# Patient Record
Sex: Female | Born: 1960 | Race: Black or African American | Hispanic: No | Marital: Single | Smoking: Never smoker
Health system: Southern US, Community
[De-identification: ages and names within clinical notes are randomized; demographics above are authoritative.]

## PROBLEM LIST (undated history)

## (undated) DIAGNOSIS — E119 Type 2 diabetes mellitus without complications: Secondary | ICD-10-CM

---

## 2006-01-14 ENCOUNTER — Ambulatory Visit (HOSPITAL_COMMUNITY): Admission: RE | Admit: 2006-01-14 | Discharge: 2006-01-14 | Payer: Self-pay | Admitting: Obstetrics & Gynecology

## 2006-01-27 ENCOUNTER — Encounter: Admission: RE | Admit: 2006-01-27 | Discharge: 2006-01-27 | Payer: Self-pay | Admitting: Internal Medicine

## 2006-05-30 ENCOUNTER — Emergency Department (HOSPITAL_COMMUNITY): Admission: EM | Admit: 2006-05-30 | Discharge: 2006-05-30 | Payer: Self-pay | Admitting: Family Medicine

## 2006-05-30 ENCOUNTER — Emergency Department (HOSPITAL_COMMUNITY): Admission: EM | Admit: 2006-05-30 | Discharge: 2006-05-30 | Payer: Self-pay | Admitting: Emergency Medicine

## 2007-01-16 ENCOUNTER — Ambulatory Visit (HOSPITAL_COMMUNITY): Admission: RE | Admit: 2007-01-16 | Discharge: 2007-01-16 | Payer: Self-pay | Admitting: Obstetrics & Gynecology

## 2007-02-24 IMAGING — US US EXTREM LOW VENOUS BILAT
1 series · 13 of 24 positions shown · non-contrast
Comparison: none

CLINICAL DATA: Symptomatic bilateral lower extremity varicose veins

Bilateral  lower extremity venous Doppler ultrasound:
TECHNIQUE: Gray-scale sonography with compression as well as color and duplex
Doppler ultrasound were performed to evaluate the deep venous system from the
level of the common femoral vein through the popliteal and proximal calf veins.
Standing gray-scale sonography and duplex Doppler evaluation of the superficial
venous system with augmentation was performed.

[Series 1: unknown · 13 of 48 slices shown]
[im 1/48]
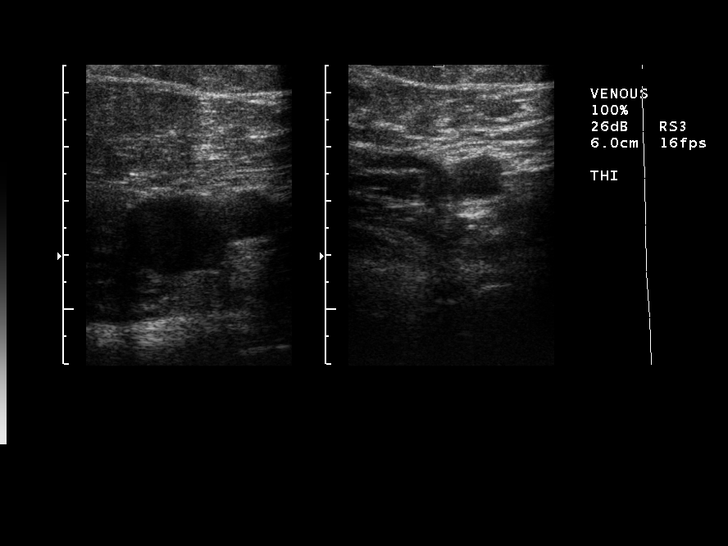
[im 5/48]
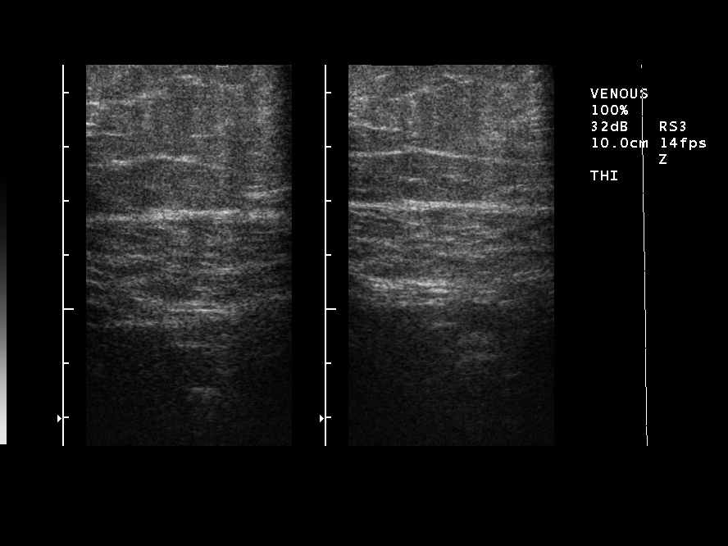
[im 9/48]
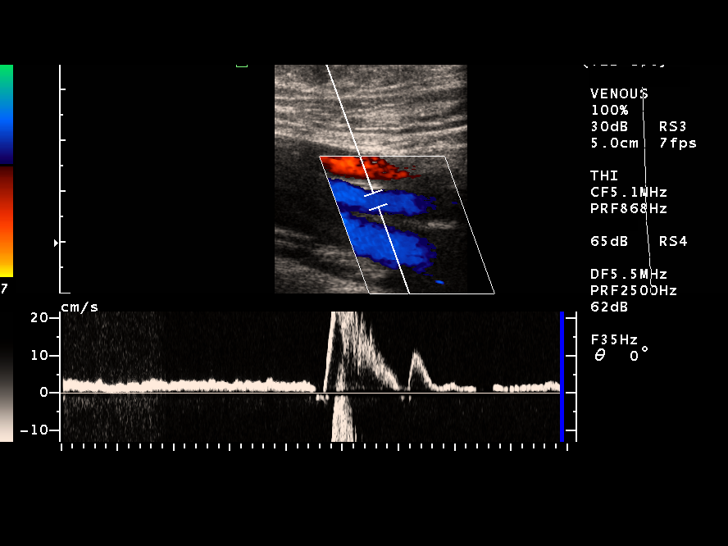
[im 13/48]
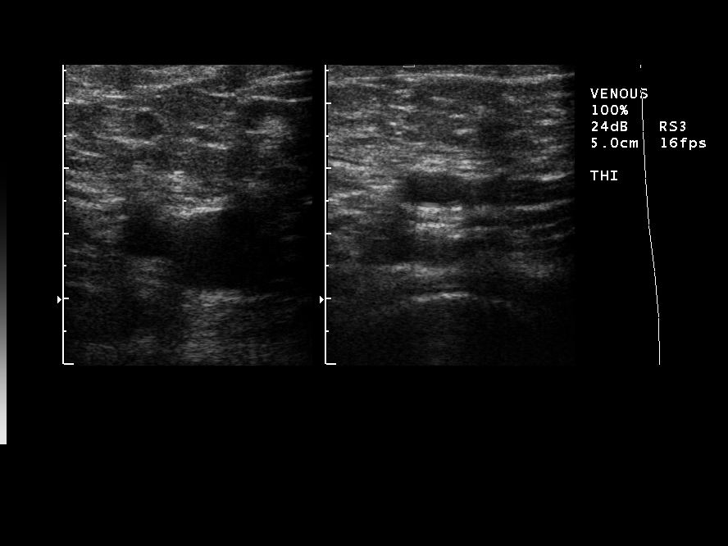
[im 17/48]
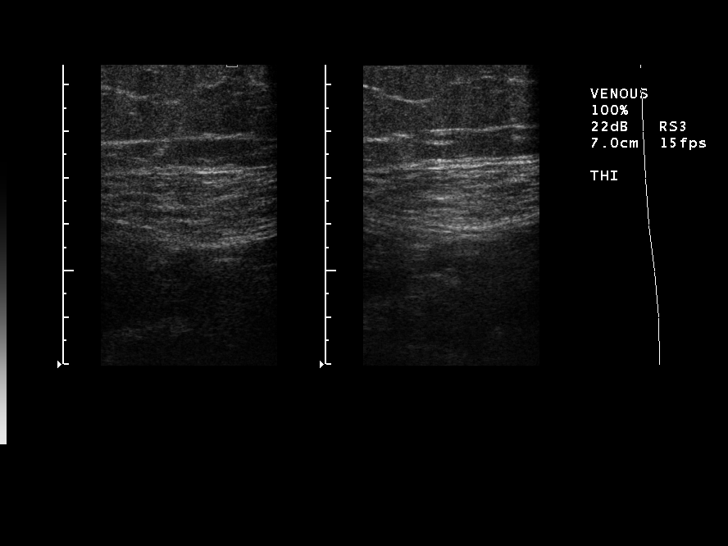
[im 21/48]
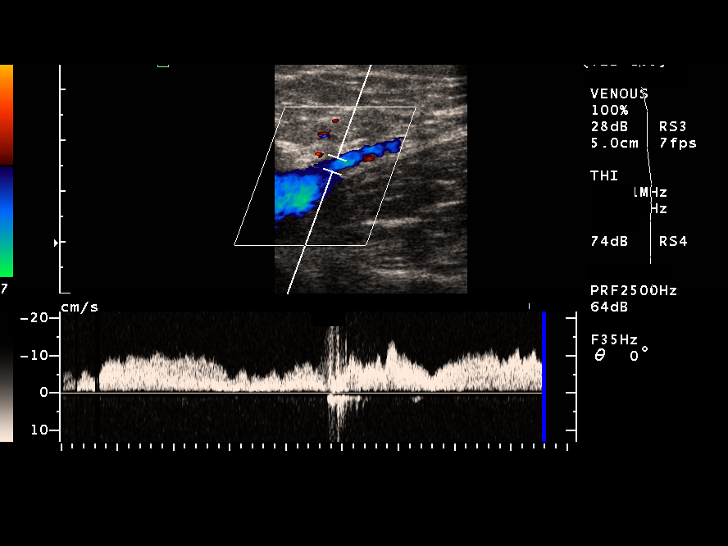
[im 25/48]
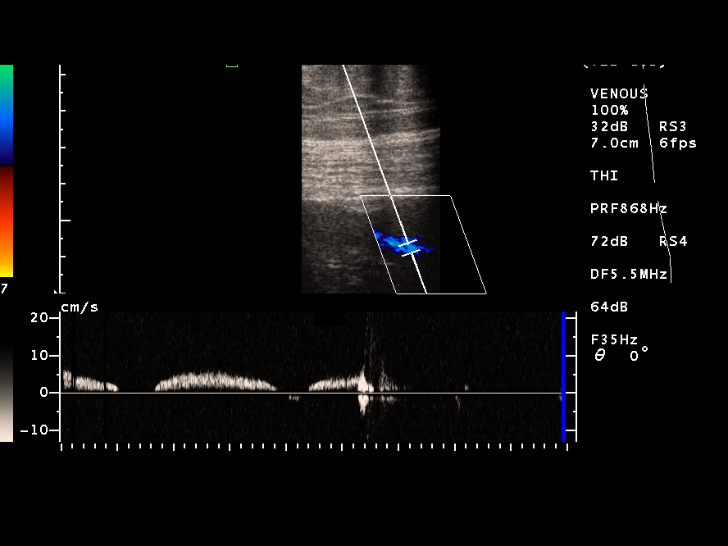
[im 27/48]
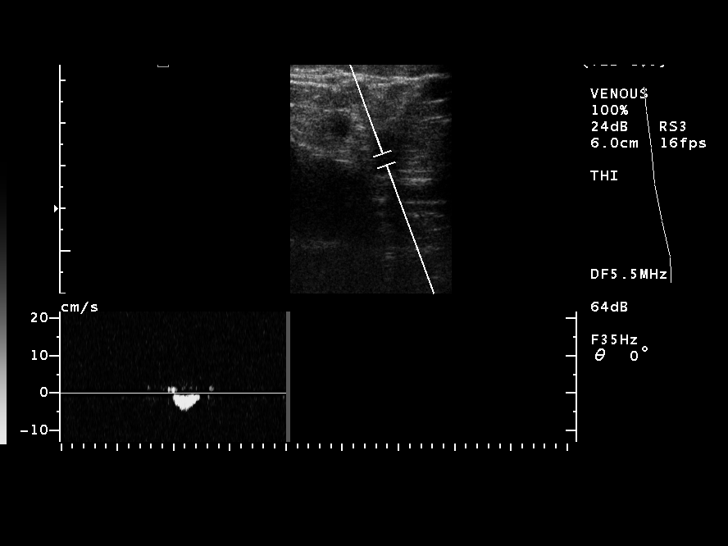
[im 31/48]
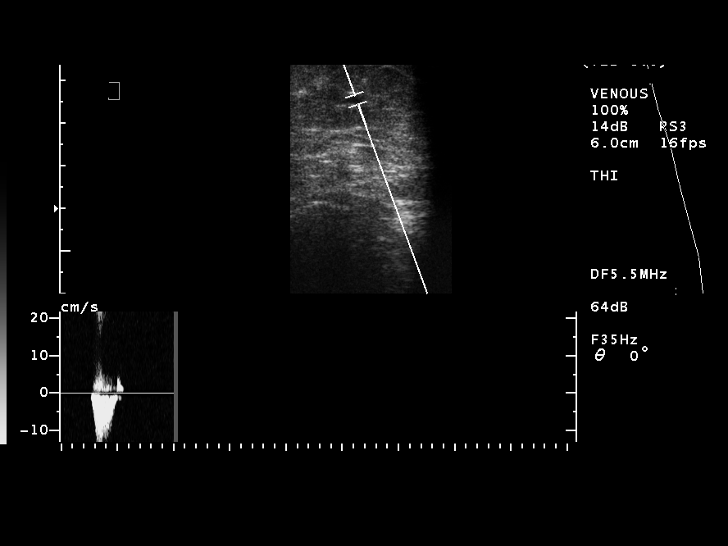
[im 35/48]
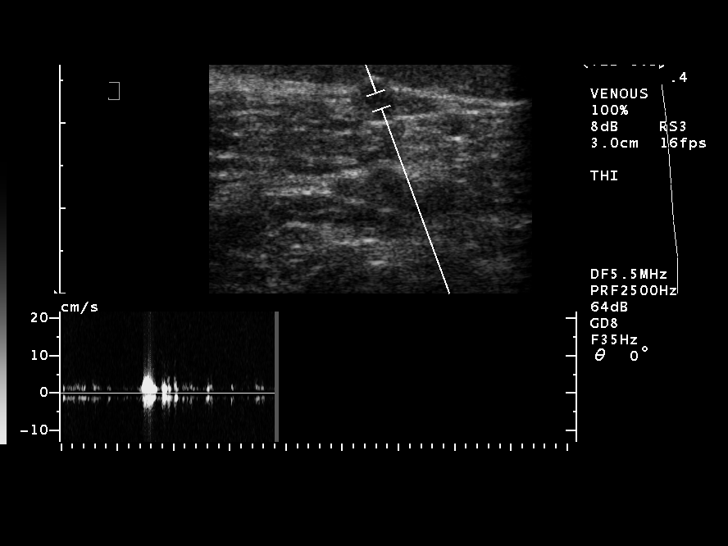
[im 39/48]
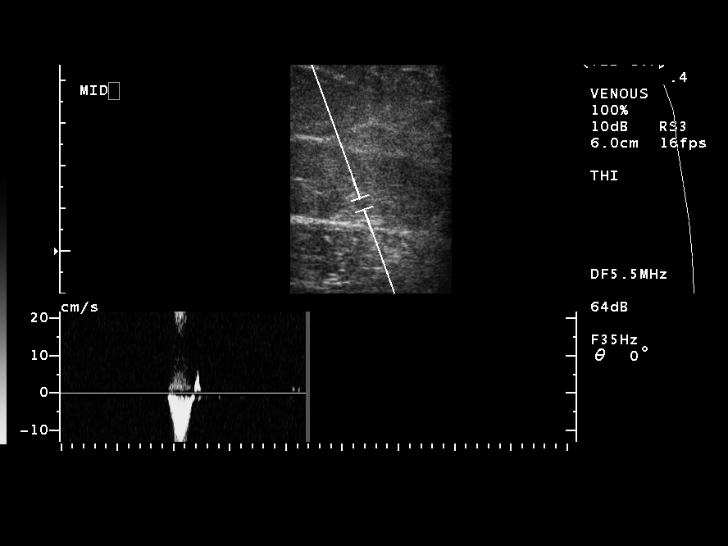
[im 43/48]
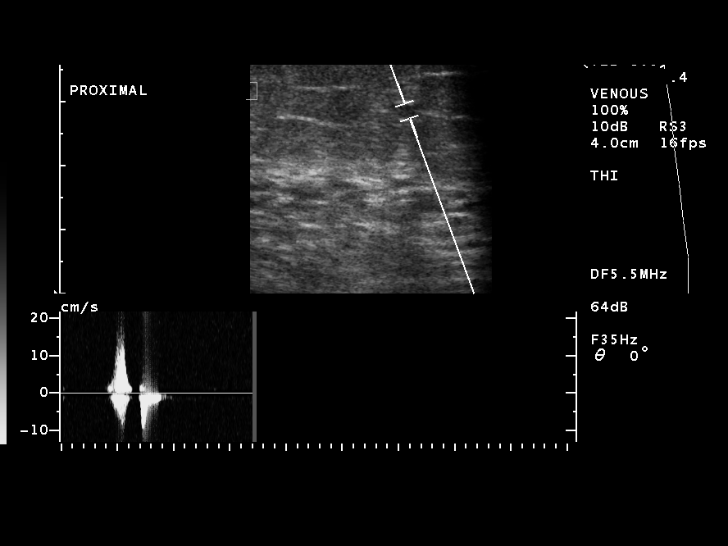
[im 48/48]
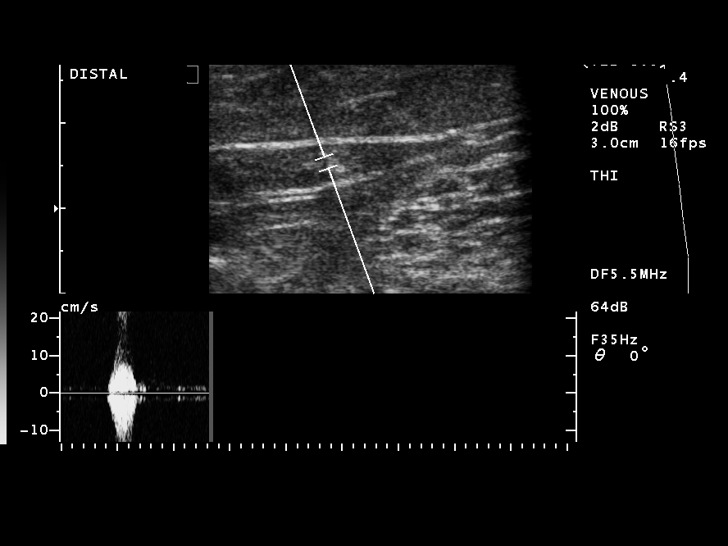

[13 of 24 positions shown; findings below may reference images not displayed]

FINDINGS: The lower extremity deep venous system demonstrates normal
compressibility, phasicity, and augmentation. No evidence of DVT.

On the right, the greater saphenous vein is normal in caliber with no reflux.
Short saphenous vein similarly unremarkable.

On the left, the greater saphenous vein is normal in caliber. Saphenofemoral
junction is patent. There is a short segment of reflux identified at the level
of the knee without direct communication to any varicose veins. The remainder of
the GSV shows valvular competence. The left short saphenous vein is
unremarkable.
IMPRESSION: 1. Normal right deep and superficial venous systems.
2. Short segment of reflux in the left GSV at the knee, with otherwise
unremarkable superficial and deep venous systems of the left lower extremity.

## 2007-08-04 ENCOUNTER — Ambulatory Visit (HOSPITAL_COMMUNITY): Admission: RE | Admit: 2007-08-04 | Discharge: 2007-08-04 | Payer: Self-pay | Admitting: Obstetrics & Gynecology

## 2008-03-22 ENCOUNTER — Ambulatory Visit (HOSPITAL_COMMUNITY): Admission: RE | Admit: 2008-03-22 | Discharge: 2008-03-22 | Payer: Self-pay | Admitting: Obstetrics & Gynecology

## 2008-05-15 ENCOUNTER — Emergency Department (HOSPITAL_COMMUNITY): Admission: EM | Admit: 2008-05-15 | Discharge: 2008-05-15 | Payer: Self-pay | Admitting: Family Medicine

## 2010-10-18 ENCOUNTER — Encounter: Payer: Self-pay | Admitting: Internal Medicine

## 2010-10-18 ENCOUNTER — Encounter: Payer: Self-pay | Admitting: Obstetrics & Gynecology

## 2015-04-14 ENCOUNTER — Emergency Department (INDEPENDENT_AMBULATORY_CARE_PROVIDER_SITE_OTHER)
Admission: EM | Admit: 2015-04-14 | Discharge: 2015-04-14 | Disposition: A | Discharge status: Home / Self Care | Payer: Self-pay | Source: Home / Self Care

## 2015-04-14 ENCOUNTER — Encounter (HOSPITAL_COMMUNITY): Payer: Self-pay | Admitting: Emergency Medicine

## 2015-04-14 DIAGNOSIS — Z041 Encounter for examination and observation following transport accident: Secondary | ICD-10-CM

## 2015-04-14 HISTORY — DX: Type 2 diabetes mellitus without complications: E11.9

## 2015-04-14 MED ORDER — DICLOFENAC POTASSIUM 50 MG PO TABS: 50.0000 mg | ORAL_TABLET | Freq: Three times a day (TID) | ORAL | Status: DC
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
## 2015-04-14 NOTE — Discharge Instructions (Signed)
Ice to shoulder and medicine as needed,

## 2015-12-28 ENCOUNTER — Encounter (HOSPITAL_COMMUNITY): Payer: Self-pay | Admitting: Emergency Medicine

## 2015-12-28 ENCOUNTER — Emergency Department (HOSPITAL_COMMUNITY): Payer: No Typology Code available for payment source

## 2015-12-28 ENCOUNTER — Emergency Department (HOSPITAL_COMMUNITY)
Admission: EM | Admit: 2015-12-28 | Discharge: 2015-12-28 | Disposition: A | Discharge status: Home / Self Care | Payer: No Typology Code available for payment source | Attending: Physician Assistant | Admitting: Physician Assistant

## 2015-12-28 DIAGNOSIS — Y9389 Activity, other specified: Secondary | ICD-10-CM | POA: Insufficient documentation

## 2015-12-28 DIAGNOSIS — S29001A Unspecified injury of muscle and tendon of front wall of thorax, initial encounter: Secondary | ICD-10-CM | POA: Insufficient documentation

## 2015-12-28 DIAGNOSIS — Y92410 Unspecified street and highway as the place of occurrence of the external cause: Secondary | ICD-10-CM | POA: Insufficient documentation

## 2015-12-28 DIAGNOSIS — E119 Type 2 diabetes mellitus without complications: Secondary | ICD-10-CM | POA: Insufficient documentation

## 2015-12-28 DIAGNOSIS — Y998 Other external cause status: Secondary | ICD-10-CM | POA: Diagnosis not present

## 2015-12-28 DIAGNOSIS — S299XXA Unspecified injury of thorax, initial encounter: Secondary | ICD-10-CM | POA: Diagnosis present

## 2015-12-28 MED ORDER — IBUPROFEN 800 MG PO TABS
800.0000 mg | ORAL_TABLET | Freq: Once | ORAL | Status: AC
  Administered 2015-12-28: 800 mg via ORAL
  Filled 2015-12-28: qty 1

## 2015-12-28 MED ORDER — IBUPROFEN 800 MG PO TABS: 800.0000 mg | ORAL_TABLET | Freq: Three times a day (TID) | ORAL | Status: DC

## 2015-12-28 MED ORDER — CYCLOBENZAPRINE HCL 10 MG PO TABS: 10.0000 mg | ORAL_TABLET | Freq: Two times a day (BID) | ORAL | Status: DC | PRN
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
## 2017-10-30 ENCOUNTER — Other Ambulatory Visit: Payer: Self-pay

## 2017-10-30 ENCOUNTER — Emergency Department (HOSPITAL_COMMUNITY): Payer: Self-pay

## 2017-10-30 ENCOUNTER — Emergency Department (HOSPITAL_COMMUNITY)
Admission: EM | Admit: 2017-10-30 | Discharge: 2017-10-30 | Disposition: A | Discharge status: Home / Self Care | Payer: Self-pay | Attending: Emergency Medicine | Admitting: Emergency Medicine

## 2017-10-30 ENCOUNTER — Encounter (HOSPITAL_COMMUNITY): Payer: Self-pay | Admitting: Emergency Medicine

## 2017-10-30 DIAGNOSIS — Z79899 Other long term (current) drug therapy: Secondary | ICD-10-CM | POA: Insufficient documentation

## 2017-10-30 DIAGNOSIS — K029 Dental caries, unspecified: Secondary | ICD-10-CM | POA: Insufficient documentation

## 2017-10-30 DIAGNOSIS — K0889 Other specified disorders of teeth and supporting structures: Secondary | ICD-10-CM

## 2017-10-30 DIAGNOSIS — E119 Type 2 diabetes mellitus without complications: Secondary | ICD-10-CM | POA: Insufficient documentation

## 2017-10-30 LAB — BASIC METABOLIC PANEL
ANION GAP: 9 (ref 5–15)
BUN: 11 mg/dL (ref 6–20)
CALCIUM: 9.6 mg/dL (ref 8.9–10.3)
CO2: 26 mmol/L (ref 22–32)
Chloride: 106 mmol/L (ref 101–111)
Creatinine, Ser: 0.75 mg/dL (ref 0.44–1.00)
GFR calc Af Amer: >60 mL/min (ref >60)
GFR calc non Af Amer: >60 mL/min (ref >60)
GLUCOSE: 106 mg/dL — AB (ref 65–99)
Potassium: 3.5 mmol/L (ref 3.5–5.1)
Sodium: 141 mmol/L (ref 135–145)

## 2017-10-30 LAB — CBC WITH DIFFERENTIAL/PLATELET
BASOS PCT: 0 %
Basophils Absolute: 0 10*3/uL (ref 0.0–0.1)
Eosinophils Absolute: 0.1 10*3/uL (ref 0.0–0.7)
Eosinophils Relative: 1 %
HEMATOCRIT: 40 % (ref 36.0–46.0)
Hemoglobin: 13.2 g/dL (ref 12.0–15.0)
Lymphocytes Relative: 36 %
Lymphs Abs: 3.1 10*3/uL (ref 0.7–4.0)
MCH: 28.1 pg (ref 26.0–34.0)
MCHC: 33 g/dL (ref 30.0–36.0)
MCV: 85.1 fL (ref 78.0–100.0)
MONO ABS: 0.9 10*3/uL (ref 0.1–1.0)
MONOS PCT: 10 %
NEUTROS ABS: 4.7 10*3/uL (ref 1.7–7.7)
Neutrophils Relative %: 53 %
Platelets: 139 10*3/uL — ABNORMAL LOW (ref 150–400)
RBC: 4.7 MIL/uL (ref 3.87–5.11)
RDW: 15.3 % (ref 11.5–15.5)
WBC: 8.8 10*3/uL (ref 4.0–10.5)

## 2017-10-30 MED ORDER — IOPAMIDOL (ISOVUE-300) INJECTION 61%
INTRAVENOUS | Status: AC
  Administered 2017-10-30: 75 mL
  Filled 2017-10-30: qty 75

## 2017-10-30 MED ORDER — PENICILLIN V POTASSIUM 500 MG PO TABS: 500.0000 mg | ORAL_TABLET | Freq: Four times a day (QID) | ORAL | 0 refills | Status: AC

## 2017-10-30 MED ORDER — ACETAMINOPHEN 325 MG PO TABS
650.0000 mg | ORAL_TABLET | Freq: Once | ORAL | Status: AC
  Administered 2017-10-30: 650 mg via ORAL
  Filled 2017-10-30: qty 2

## 2017-10-30 MED ORDER — SODIUM CHLORIDE 0.9 % IV BOLUS (SEPSIS)
1000.0000 mL | Freq: Once | INTRAVENOUS | Status: AC
  Administered 2017-10-30: 1000 mL via INTRAVENOUS

## 2017-10-30 MED ORDER — PENICILLIN V POTASSIUM 500 MG PO TABS
500.0000 mg | ORAL_TABLET | Freq: Once | ORAL | Status: AC
  Administered 2017-10-30: 500 mg via ORAL
  Filled 2017-10-30: qty 1

## 2017-10-30 MED ORDER — KETOROLAC TROMETHAMINE 30 MG/ML IJ SOLN
30.0000 mg | Freq: Once | INTRAMUSCULAR | Status: AC
Start: 2017-10-30 — End: 2017-10-30
  Administered 2017-10-30: 30 mg via INTRAVENOUS
  Filled 2017-10-30: qty 1

## 2017-10-30 MED ORDER — NAPROXEN 500 MG PO TABS: 500.0000 mg | ORAL_TABLET | Freq: Two times a day (BID) | ORAL | 0 refills | Status: DC

## 2017-10-30 MED ORDER — ONDANSETRON HCL 4 MG/2ML IJ SOLN
4.0000 mg | Freq: Once | INTRAMUSCULAR | Status: AC
  Administered 2017-10-30: 4 mg via INTRAVENOUS
  Filled 2017-10-30: qty 2

## 2017-10-30 MED ORDER — LIDOCAINE VISCOUS 2 % MT SOLN: 15.0000 mL | OROMUCOSAL | 2 refills | Status: DC | PRN

## 2017-10-30 NOTE — Discharge Instructions (Signed)
You have been seen today for dental pain. You should follow up with a dentist as soon as possible. This problem will not resolve on its own without the care of a dentist. Use ibuprofen or naproxen for pain. Use the viscous lidocaine for mouth pain. Swish with the lidocaine and spit it out. Do not swallow it. ° °Please take all of your antibiotics until finished!   You may develop abdominal discomfort or diarrhea from the antibiotic.  You may help offset this with probiotics which you can buy or get in yogurt. Do not eat or take the probiotics until 2 hours after your antibiotic.  ° °Antiinflammatory medications: Take 600 mg of ibuprofen every 6 hours or 440 mg (over the counter dose) to 500 mg (prescription dose) of naproxen every 12 hours for the next 3 days. After this time, these medications may be used as needed for pain. Take these medications with food to avoid upset stomach. Choose only one of these medications, do not take them together. °Tylenol: Should you continue to have additional pain while taking the ibuprofen or naproxen, you may add in tylenol as needed. Your daily total maximum amount of tylenol from all sources should be limited to 4000mg/day for persons without liver problems, or 2000mg/day for those with liver problems. ° °Dental Resource Guide ° °[facility] °[address], [address] °[place], [place] [address] °[phone] ° °[facility] °[address] °[place], [place] [address] °[phone] ° °[facility] °[address] °[place], [place] [address] °[phone]. [phone] ° °[facility] °[address] °[place], [place] [address] °[phone] ° °[facility] °[address] °[place], [place] [address] °[phone] ° °[employer] °Www.[facility].[employer].edu/patientcare/studentclinics/becomepatient ° °[facility] [facility] °[address] °[place], [place] [address] °[phone] ° °Website for free, low-income, or sliding scale dental services in [place]: °www.freedental.us ° °To find a dentist in [place] and surrounding areas: °www.ncdental.org/for-the-public/find-a-dentist ° °[facility] °http://www.ncdental.org/meetings-events/[place] ° °[facility] °https://dma.ncdhhs.gov/find-a-doctor/medicaid-dental-providers ° ° °

## 2017-10-30 NOTE — ED Triage Notes (Signed)
Pt reports tooth pain 2x days with visible swelling starting today. Denies fever, but c/o chills. Says it is not "too hard to swallow, but I only ate some jello yesterday." Denies difficulty breathing. Complains of body aches.

## 2017-10-30 NOTE — ED Provider Notes (Signed)
[facility]-EMERGENCY DEPT Provider Note   CSN: [id number] Arrival date & time: 10/30/17  1258     History   Chief Complaint Chief Complaint  Patient presents with  . Dental Pain  . Oral Swelling    HPI Margaret Crosby is a 57 y.o. female.  HPI   Margaret Crosby is a 57 y.o. female, with a history of DM, presenting to the ED with left lower dental pain for the last 2 days.  Accompanied by chills, nausea, body aches, and facial swelling.  Has been taking OTC medications without improvement.  Pain is currently 8/10, aching, radiating posteriorly, superiorly, and inferiorly into the neck.  Patient denies known fever, vomiting, drooling, difficulty breathing, or any other complaints.     Past Medical History:  Diagnosis Date  . Diabetes mellitus without complication (HCC)     There are no active problems to display for this patient.   History reviewed. No pertinent surgical history.  OB History    No data available       Home Medications    Prior to Admission medications   Medication Sig Start Date End Date Taking? Authorizing Provider  cyclobenzaprine (FLEXERIL) 10 MG tablet Take 1 tablet (10 mg total) by mouth 2 (two) times daily as needed for muscle spasms. 12/28/15   Mackuen, Courteney Lyn, MD  ibuprofen (ADVIL,MOTRIN) 200 MG tablet Take 800 mg by mouth every 6 (six) hours as needed for headache or moderate pain.    [provider]  ibuprofen (ADVIL,MOTRIN) 800 MG tablet Take 1 tablet (800 mg total) by mouth 3 (three) times daily. 12/28/15   Mackuen, Courteney Lyn, MD  lidocaine (XYLOCAINE) 2 % solution Use as directed 15 mLs in the mouth or throat as needed for mouth pain. 10/30/17   Adalaya Irion C, PA-C  naproxen (NAPROSYN) 500 MG tablet Take 1 tablet (500 mg total) by mouth 2 (two) times daily. 10/30/17   Divine Hansley C, PA-C  penicillin v potassium (VEETID) 500 MG tablet Take 1 tablet (500 mg total) by mouth 4 (four) times daily for 7 days. 10/30/17  11/06/17  Anselm PancoastJoy, Carlissa Pesola C, PA-C    Family History History reviewed. No pertinent family history.  Social History Social History   Tobacco Use  . Smoking status: Never Smoker  Substance Use Topics  . Alcohol use: Yes  . Drug use: No     Allergies   Patient has no known allergies.   Review of Systems Review of Systems  Constitutional: Positive for chills.  HENT: Positive for dental problem and facial swelling. Negative for drooling, sore throat and trouble swallowing.   Respiratory: Negative for shortness of breath.   Gastrointestinal: Positive for nausea. Negative for vomiting.  Musculoskeletal: Negative for neck pain and neck stiffness.  Neurological: Negative for headaches.  All other systems reviewed and are negative.    Physical Exam Updated Vital Signs BP (!) 143/81   Pulse 76   Temp 100.2 F (37.9 C) (Oral)   Resp 16   SpO2 97%   Physical Exam  Constitutional: She appears well-developed and well-nourished. No distress.  HENT:  Head: Normocephalic and atraumatic.    Mouth/Throat: Uvula is midline and mucous membranes are normal. No trismus in the jaw.  Erosion and tenderness noted to the left rearmost mandibular molar.  Exquisite tenderness and edema extends to the left buccal surface.  No noted area of gingival fluctuance.   Externally, swelling and tenderness to the left side of the  face; swelling and tenderness extends inferiorly to the submental and submandibular spaces. Mouth opening limited to about 2 finger widths.  No trismus.  Handles oral secretions without difficulty.  Eyes: Conjunctivae are normal.  Neck: Neck supple.  Cardiovascular: Normal rate, regular rhythm, normal heart sounds and intact distal pulses.  Pulmonary/Chest: Effort normal and breath sounds normal. No respiratory distress.  Abdominal: Soft. There is no tenderness. There is no guarding.  Musculoskeletal: She exhibits no edema.  Lymphadenopathy:    She has no cervical adenopathy.    Neurological: She is alert.  Skin: Skin is warm and dry. She is not diaphoretic.  Psychiatric: She has a normal mood and affect. Her behavior is normal.  Nursing note and vitals reviewed.    ED Treatments / Results  Labs (all labs ordered are listed, but only abnormal results are displayed) Labs Reviewed  BASIC METABOLIC PANEL - Abnormal; Notable for the following components:      Result Value   Glucose, Bld 106 (*)    All other components within normal limits  CBC WITH DIFFERENTIAL/PLATELET - Abnormal; Notable for the following components:   Platelets 139 (*)    All other components within normal limits    EKG  EKG Interpretation None       Radiology Ct Maxillofacial W Contrast  Result Date: 10/30/2017 CLINICAL DATA:  Tooth pain for 2 days. EXAM: CT MAXILLOFACIAL WITH CONTRAST TECHNIQUE: Multidetector CT imaging of the maxillofacial structures was performed with intravenous contrast. Multiplanar CT image reconstructions were also generated. CONTRAST:  75mL ISOVUE-300 IOPAMIDOL (ISOVUE-300) INJECTION 61% COMPARISON:  None. FINDINGS: Osseous: No fracture or mandibular dislocation. No malignant destructive process. In the LEFT mandible, there is periapical lucency associated with the LEFT third molar. Large dental caries is observed. Orbits: Negative. No traumatic or inflammatory finding. Sinuses: No significant opacity or layering fluid. Small RIGHT maxillary sinus. Soft tissues: LEFT facial soft tissue swelling and reactive adenopathy. No drainable abscess, but a roughly 2 x 4 cm phlegmon is seen along the body of the LEFT mandible. Enlarged LEFT masseter muscle, possible myositis. Limited intracranial: No significant or unexpected finding. IMPRESSION: LEFT third molar dental caries with periapical lucency. Associated LEFT facial soft tissue swelling and phlegmon, without drainable abscess. Enlarged LEFT masseter muscle, possible myositis. Electronically Signed   By: Elsie Stain M.D.    On: 10/30/2017 15:35    Procedures Procedures (including critical care time)  Medications Ordered in ED Medications  sodium chloride 0.9 % bolus 1,000 mL (0 mLs Intravenous Stopped 10/30/17 1610)  ketorolac (TORADOL) 30 MG/ML injection 30 mg (30 mg Intravenous Given 10/30/17 1422)  ondansetron (ZOFRAN) injection 4 mg (4 mg Intravenous Given 10/30/17 1422)  acetaminophen (TYLENOL) tablet 650 mg (650 mg Oral Given 10/30/17 1422)  penicillin v potassium (VEETID) tablet 500 mg (500 mg Oral Given 10/30/17 1433)  iopamidol (ISOVUE-300) 61 % injection (75 mLs  Contrast Given 10/30/17 1507)     Initial Impression / Assessment and Plan / ED Course  I have reviewed the triage vital signs and the nursing notes.  Pertinent labs & imaging results that were available during my care of the patient were reviewed by me and considered in my medical decision making (see chart for details).  Clinical Course as of Oct 30 1617  Wynelle Link Oct 30, 2017  1555 Discussed imaging and lab results with the patient.  Currently pain-free.  [SJ]    Clinical Course User Index [SJ] Anselm Pancoast, PA-C    Patient presents with  dental pain and facial swelling. CT imaging ordered due to the extent of the patient's swelling and tenderness extending to the submandibular region combined with her decreased jaw opening, elevated temperature, and history of diabetes. Doubt sepsis or Ludwig's angioedema.  No leukocytosis.  No evidence of abscess on CT.  At discharge, patient is nontoxic appearing, afebrile, not tachycardic, not tachypneic, not hypotensive, maintains SPO2 of 97-98% on room air, and is in no apparent distress. Dental follow up.  Resources given. The patient was given instructions for home care as well as return precautions. Patient voices understanding of these instructions, accepts the plan, and is comfortable with discharge.  Vitals:   10/30/17 1328 10/30/17 1610  BP: (!) 143/81 (!) 157/78  Pulse: 76 89  Resp: 16 16   Temp: 100.2 F (37.9 C) 99.1 F (37.3 C)  TempSrc: Oral Oral  SpO2: 97% 98%     Final Clinical Impressions(s) / ED Diagnoses   Final diagnoses:  Pain, dental  Pain due to dental caries    ED Discharge Orders        Ordered    naproxen (NAPROSYN) 500 MG tablet  2 times daily     10/30/17 1549    penicillin v potassium (VEETID) 500 MG tablet  4 times daily     10/30/17 1549    lidocaine (XYLOCAINE) 2 % solution  As needed     10/30/17 1549       Anselm Pancoast, PA-C 10/30/17 1619    Lorre Nick, MD 11/01/17 1110

## 2017-12-16 ENCOUNTER — Ambulatory Visit (HOSPITAL_COMMUNITY)
Admission: EM | Admit: 2017-12-16 | Discharge: 2017-12-16 | Disposition: A | Discharge status: Home / Self Care | Payer: Self-pay | Attending: Emergency Medicine | Admitting: Emergency Medicine

## 2017-12-16 ENCOUNTER — Other Ambulatory Visit: Payer: Self-pay

## 2017-12-16 ENCOUNTER — Encounter (HOSPITAL_COMMUNITY): Payer: Self-pay

## 2017-12-16 DIAGNOSIS — L237 Allergic contact dermatitis due to plants, except food: Secondary | ICD-10-CM

## 2017-12-16 MED ORDER — HYDROXYZINE HCL 25 MG PO TABS: 25.0000 mg | ORAL_TABLET | Freq: Four times a day (QID) | ORAL | 0 refills | Status: DC | PRN

## 2017-12-16 MED ORDER — MUPIROCIN 2 % EX OINT: 1.0000 "application " | TOPICAL_OINTMENT | Freq: Three times a day (TID) | CUTANEOUS | 0 refills | Status: AC

## 2017-12-16 MED ORDER — PREDNISONE 10 MG PO TABS: ORAL_TABLET | ORAL | 0 refills | Status: DC

## 2017-12-16 NOTE — Discharge Instructions (Addendum)
Use TecNu before going out in areas with known poison ivy/oak.  This will help prevent you from getting poison ivy/oak.  If you get a rash, you can use Zanfel or TecNu extreme to deactivate the oil, which will stop the rash from spreading and help with the itching.  Apply antibacterial ointment on scabbed areas to help prevent infection.  If you were given steroids, make sure you finish all of them.  You may take Claritin, Allegra, Zyrtec during the day, Benadryl at night.  If the Claritin Allegra, Zyrtec and the Benadryl did not work, try the Atarax.  Discontinue the other antihistamines if you take the Atarax.  Dissolve 1 packet (or tablet) of Domeboro (aluminum acetate) in 1 pint of luke-warm water. Soak the affected areas with luke-warm Domeboro solution for 5-10 minutes twice daily. You may use apply gauze soaked in the domeboro.  Gently pat dry, Then apply the antibiotic cream. You may also take oatmeal baths with Aveeno oatmeal (1 cup in half full bathtub) or cornstarch/baking soda (1 cup each in half full bathtub). To prevent the oatmeal from caking in pipes, place it in a tied sock before dropping it into the bathtub.  Below is a list of primary care practices who are taking new patients for you to follow-up with. [facility] [address] [place], [place] [address] [phone]  [facility] [phone] [address] [place] [address]  [facility]: [address] [place] [id number]  [phone]  [facility]: [address] [place] [id number]   [phone]  [facility]: [address] [place] [address]  [phone]  [facility] : [address] [place] [address] [phone]  [facility]: [address] [place] [phone]  [facility]: [address] [place] [id number] [phone]  Dr. Oneal GroutMahima Pandey [address] [facility] [place] [id number]  [phone]  Dr. Jackie PlumGeorge Osei-Bonsu, [facility]. [address] [place], [place] [address]  [phone]  Go to www.goodrx.com to look up your medications. This will give you a list of where you can find your prescriptions at the most affordable prices. Or ask the pharmacist what the cash price is, or if they have any other discount programs available to help make your medication more affordable. This can be less expensive than what you would pay with insurance.

## 2017-12-16 NOTE — ED Provider Notes (Signed)
HPI  SUBJECTIVE:  Margaret Crosby is a 58 y.o. female who presents with an intensely itchy rash for the past 4-5 days.  States it started on her arms, and has spread to her neck, chest, stomach and in between her breasts.  She reports blisters, edema and soreness along her arms.  She reports nonmigratory urticaria.  States it is oozing clear fluid.  She tried calamine lotion with improvement in her symptoms.  Also tried Benadryl.  Symptoms are worse with taking a hot shower.  No fevers, body aches.  States is itchy all day long.  No blood on the bed clothes or sensation of being bitten at night.  No new lotions, soaps, detergents, foods, change in her medications.  She was working in the yard 1-2 days before her symptoms started, cutting vines off of a tree.  She has a past medical history of diet and exercise controlled diabetes and states that her sugar has been running within normal limits for her.  She does not take any medications for this currently. she was on metformin at one point.  She also has a past medical history of eczema.  PMD: None.    Past Medical History:  Diagnosis Date  . Diabetes mellitus without complication (H[facility])     History reviewed. No pertinent surgical history.  History reviewed. No pertinent family history.  Social History   Tobacco Use  . Smoking status: Never Smoker  Substance Use Topics  . Alcohol use: Yes  . Drug use: No    No current facility-administered medications for this encounter.   Current Outpatient Medications:  .  hydrOXYzine (ATARAX/VISTARIL) 25 MG tablet, Take 1 tablet (25 mg total) by mouth every 6 (six) hours as needed for itching., Disp: 20 tablet, Rfl: 0 .  mupirocin ointment (BACTROBAN) 2 %, Apply 1 application topically 3 (three) times daily., Disp: 22 g, Rfl: 0 .  predniSONE (DELTASONE) 10 MG tablet, 6 tabs on day 1-2, 5 tabs on day 3-4, 4 tabs on day 5-6, 3 tabs on day 7-8, 2 tabs day 9-10, 1 tab day 11-12, Disp: 42 tablet, Rfl:  0  No Known Allergies   ROS  As noted in HPI.   Physical Exam  BP 130/64 (BP Location: Left Arm)   Pulse 76   Temp (!) 97.5 F (36.4 C) (Oral)   Resp 16   SpO2 96%   Constitutional: Well developed, well nourished, no acute distress Eyes:  EOMI, conjunctiva normal bilaterally HENT: Normocephalic, atraumatic,mucus membranes moist Respiratory: Normal inspiratory effort Cardiovascular: Normal rate GI: nondistended skin: Blistery, erythematous, blanchable, nontender urticaria, plaques, linear distribution.  Her forearms, neck, symmetric rash in between the breasts.  No crusting.  See pictures.           Musculoskeletal: no deformities Neurologic: Alert & oriented x 3, no focal neuro deficits Psychiatric: Speech and behavior appropriate   ED Course   Medications - No data to display  No orders of the defined types were placed in this encounter.   No results found for this or any previous visit (from the past 24 hour(s)). No results found.  ED Clinical Impression  Poison ivy dermatitis   ED Assessment/Plan  Presentation consistent with a poison ivy dermatitis.  Will send home with Zyrtec, if this does not work, then Atarax, Bactroban to help prevent infection and a prednisone taper.  Feel that it is extensive enough that the benefits of getting a 12-day prednisone taper outweigh the risks.  Advised patient  to restart the metformin if her glucose becomes elevated.  Discussed with her signs and symptoms of DKA , she will go to the ER for this.  Will order a primary care referral for further management of her diabetes, And will provide a primary care referral list.  Patient agrees with plan.  Meds ordered this encounter  Medications  . hydrOXYzine (ATARAX/VISTARIL) 25 MG tablet    Sig: Take 1 tablet (25 mg total) by mouth every 6 (six) hours as needed for itching.    Dispense:  20 tablet    Refill:  0  . mupirocin ointment (BACTROBAN) 2 %    Sig: Apply 1  application topically 3 (three) times daily.    Dispense:  22 g    Refill:  0  . predniSONE (DELTASONE) 10 MG tablet    Sig: 6 tabs on day 1-2, 5 tabs on day 3-4, 4 tabs on day 5-6, 3 tabs on day 7-8, 2 tabs day 9-10, 1 tab day 11-12    Dispense:  42 tablet    Refill:  0    *This clinic note was created using Scientist, clinical (histocompatibility and immunogenetics)Dragon dictation software. Therefore, there may be occasional mistakes despite careful proofreading.   ?   Domenick GongMortenson, Ryden Wainer, MD 12/16/17 2028

## 2017-12-16 NOTE — ED Triage Notes (Signed)
[facility], neck, chest, and stomach x3 days. Pt has applied calamine lotion for itching and has taken benadryl , but has no relief

## 2019-07-16 ENCOUNTER — Encounter (HOSPITAL_COMMUNITY): Payer: Self-pay

## 2019-07-16 ENCOUNTER — Ambulatory Visit (HOSPITAL_COMMUNITY)
Admission: EM | Admit: 2019-07-16 | Discharge: 2019-07-16 | Disposition: A | Discharge status: Home / Self Care | Payer: Self-pay | Attending: Internal Medicine | Admitting: Internal Medicine

## 2019-07-16 ENCOUNTER — Other Ambulatory Visit: Payer: Self-pay

## 2019-07-16 DIAGNOSIS — Z91038 Other insect allergy status: Secondary | ICD-10-CM

## 2019-07-16 DIAGNOSIS — S60562A Insect bite (nonvenomous) of left hand, initial encounter: Secondary | ICD-10-CM

## 2019-07-16 MED ORDER — PREDNISONE 20 MG PO TABS: 20.0000 mg | ORAL_TABLET | Freq: Every day | ORAL | 0 refills | Status: AC

## 2019-07-16 MED ORDER — HYDROXYZINE HCL 25 MG PO TABS: 25.0000 mg | ORAL_TABLET | Freq: Four times a day (QID) | ORAL | 0 refills | Status: AC | PRN

## 2019-07-16 NOTE — ED Provider Notes (Addendum)
Margaret Crosby    CSN: [id number] Arrival date & time: 07/16/19  1359      History   Chief Complaint Chief Complaint  Patient presents with  . Insect Bite    HPI Margaret Crosby is a 58 y.o. female with no past medical history comes to urgent care with left hand swelling of 2 days duration.   Symptoms started soon after patient was bitten by fire ants.  Over the past couple of days swelling has been persistent.  She has developed some blisters on the forearm and hand.  Patient denies any shortness of breath, wheezing or choking sensation after the bite.  No relieving factors.  No known aggravating factors.  Symptoms are associated with itching on the dorsum of the left hand.  HPI  Past Medical History:  Diagnosis Date  . Diabetes mellitus without complication ([place])     There are no active problems to display for this patient.   History reviewed. No pertinent surgical history.  OB History   No obstetric history on file.      Home Medications    Prior to Admission medications   Medication Sig Start Date End Date Taking? Authorizing Provider  hydrOXYzine (ATARAX/VISTARIL) 25 MG tablet Take 1 tablet (25 mg total) by mouth every 6 (six) hours as needed for itching. 07/16/19   LampteyMyrene Galas, MD  mupirocin ointment (BACTROBAN) 2 % Apply 1 application topically 3 (three) times daily. 12/16/17   Melynda Ripple, MD  predniSONE (DELTASONE) 20 MG tablet Take 1 tablet (20 mg total) by mouth daily for 3 days. 6 tabs on day 1-2, 5 tabs on day 3-4, 4 tabs on day 5-6, 3 tabs on day 7-8, 2 tabs day 9-10, 1 tab day 11-12 07/16/19 07/19/19  Jakaden Ouzts, Myrene Galas, MD    Family History History reviewed. No pertinent family history.  Social History Social History   Tobacco Use  . Smoking status: Never Smoker  . Smokeless tobacco: Never Used  Substance Use Topics  . Alcohol use: Yes  . Drug use: No     Allergies   Patient has no known allergies.   Review of Systems  Review of Systems  Constitutional: Negative.   Respiratory: Negative.   Cardiovascular: Negative.   Gastrointestinal: Negative.   Endocrine: Negative.   Musculoskeletal: Positive for joint swelling. Negative for arthralgias and myalgias.  Skin: Positive for rash and wound.  Allergic/Immunologic: Negative.   Neurological: Negative.      Physical Exam Triage Vital Signs ED Triage Vitals  Enc Vitals Group     BP 07/16/19 1424 137/65     Pulse Rate 07/16/19 1424 83     Resp 07/16/19 1424 16     Temp 07/16/19 1424 98.4 F (36.9 C)     Temp Source 07/16/19 1424 Oral     SpO2 07/16/19 1424 97 %     Weight --      Height --      Head Circumference --      Peak Flow --      Pain Score 07/16/19 1421 0     Pain Loc --      Pain Edu? --      Excl. in [place]? --    No data found.  Updated Vital Signs BP 137/65 (BP Location: Right Arm)   Pulse 83   Temp 98.4 F (36.9 C) (Oral)   Resp 16   SpO2 97%   Visual Acuity Right Eye Distance:  Left Eye Distance:   Bilateral Distance:    Right Eye Near:   Left Eye Near:    Bilateral Near:     Physical Exam Vitals signs and nursing note reviewed.  Constitutional:      General: She is not in acute distress.    Appearance: She is not ill-appearing or toxic-appearing.  Cardiovascular:     Rate and Rhythm: Normal rate and regular rhythm.  Pulmonary:     Effort: Pulmonary effort is normal.     Breath sounds: Normal breath sounds.  Abdominal:     General: Bowel sounds are normal.     Palpations: Abdomen is soft.  Skin:    Comments: Left hand swelling.  Bullae over the left wrist and the dorsum of the left hand.  No erythema.  Neurological:     Mental Status: She is alert.      UC Treatments / Results  Labs (all labs ordered are listed, but only abnormal results are displayed) Labs Reviewed - No data to display  EKG   Radiology No results found.  Procedures Procedures (including critical care time)  Medications  Ordered in UC Medications - No data to display  Initial Impression / Assessment and Plan / UC Course  I have reviewed the triage vital signs and the nursing notes.  Pertinent labs & imaging results that were available during my care of the patient were reviewed by me and considered in my medical decision making (see chart for details).     1.  Allergic reaction to insect bite: Prednisone 20 mg orally daily for 3 days Hydroxyzine 25 mg every 6 hours as needed for itching If patient notices redness, increasing pain, fever or chills she needs to return to urgent care to be reevaluated.   Final Clinical Impressions(s) / UC Diagnoses   Final diagnoses:  Allergic reaction to insect bite   Discharge Instructions   None    ED Prescriptions    Medication Sig Dispense Auth. Provider   hydrOXYzine (ATARAX/VISTARIL) 25 MG tablet Take 1 tablet (25 mg total) by mouth every 6 (six) hours as needed for itching. 20 tablet Monifa Blanchette, Britta Mccreedy, MD   predniSONE (DELTASONE) 20 MG tablet Take 1 tablet (20 mg total) by mouth daily for 3 days. 6 tabs on day 1-2, 5 tabs on day 3-4, 4 tabs on day 5-6, 3 tabs on day 7-8, 2 tabs day 9-10, 1 tab day 11-12 3 tablet Giovani Neumeister, Britta Mccreedy, MD     PDMP not reviewed this encounter.   Merrilee Jansky, MD 07/16/19 1541    Merrilee Jansky, MD 07/16/19 [phone]

## 2019-07-16 NOTE — ED Triage Notes (Signed)
Pt states she was bit by fire ants 2 days ago in her left arm, left hand, and right ankle. Pt reports blisters, swelling and burning sensation in her left arm, left hand, and right ankle.

## 2019-09-13 ENCOUNTER — Other Ambulatory Visit: Payer: Self-pay

## 2019-09-13 DIAGNOSIS — Z20822 Contact with and (suspected) exposure to covid-19: Secondary | ICD-10-CM

## 2019-09-15 LAB — NOVEL CORONAVIRUS, NAA: SARS-CoV-2, NAA: NOT DETECTED

## 2019-09-27 ENCOUNTER — Other Ambulatory Visit: Payer: Self-pay

## 2019-09-27 DIAGNOSIS — Z20822 Contact with and (suspected) exposure to covid-19: Secondary | ICD-10-CM

## 2019-09-28 LAB — NOVEL CORONAVIRUS, NAA: SARS-CoV-2, NAA: NOT DETECTED

## 2020-06-30 ENCOUNTER — Institutional Professional Consult (permissible substitution): Payer: Self-pay | Admitting: Pulmonary Disease

## 2020-07-17 ENCOUNTER — Institutional Professional Consult (permissible substitution): Payer: Self-pay | Admitting: Pulmonary Disease

## 2020-07-28 DEATH — deceased
